# Patient Record
Sex: Male | Born: 2000 | Race: Black or African American | Hispanic: No | Marital: Single | Smoking: Never smoker
Health system: Southern US, Community
[De-identification: ages and names within clinical notes are randomized; demographics above are authoritative.]

## PROBLEM LIST (undated history)

## (undated) DIAGNOSIS — F988 Other specified behavioral and emotional disorders with onset usually occurring in childhood and adolescence: Secondary | ICD-10-CM

---
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
## 2019-06-10 IMAGING — CR DG CHEST 2V
2 series · 2 of 2 positions shown · non-contrast
Comparison: Chest x-ray of 12/01/2016

CLINICAL DATA: Cough, chest pressure and congestion

EXAM:
CHEST  2 VIEW

[chest pa]
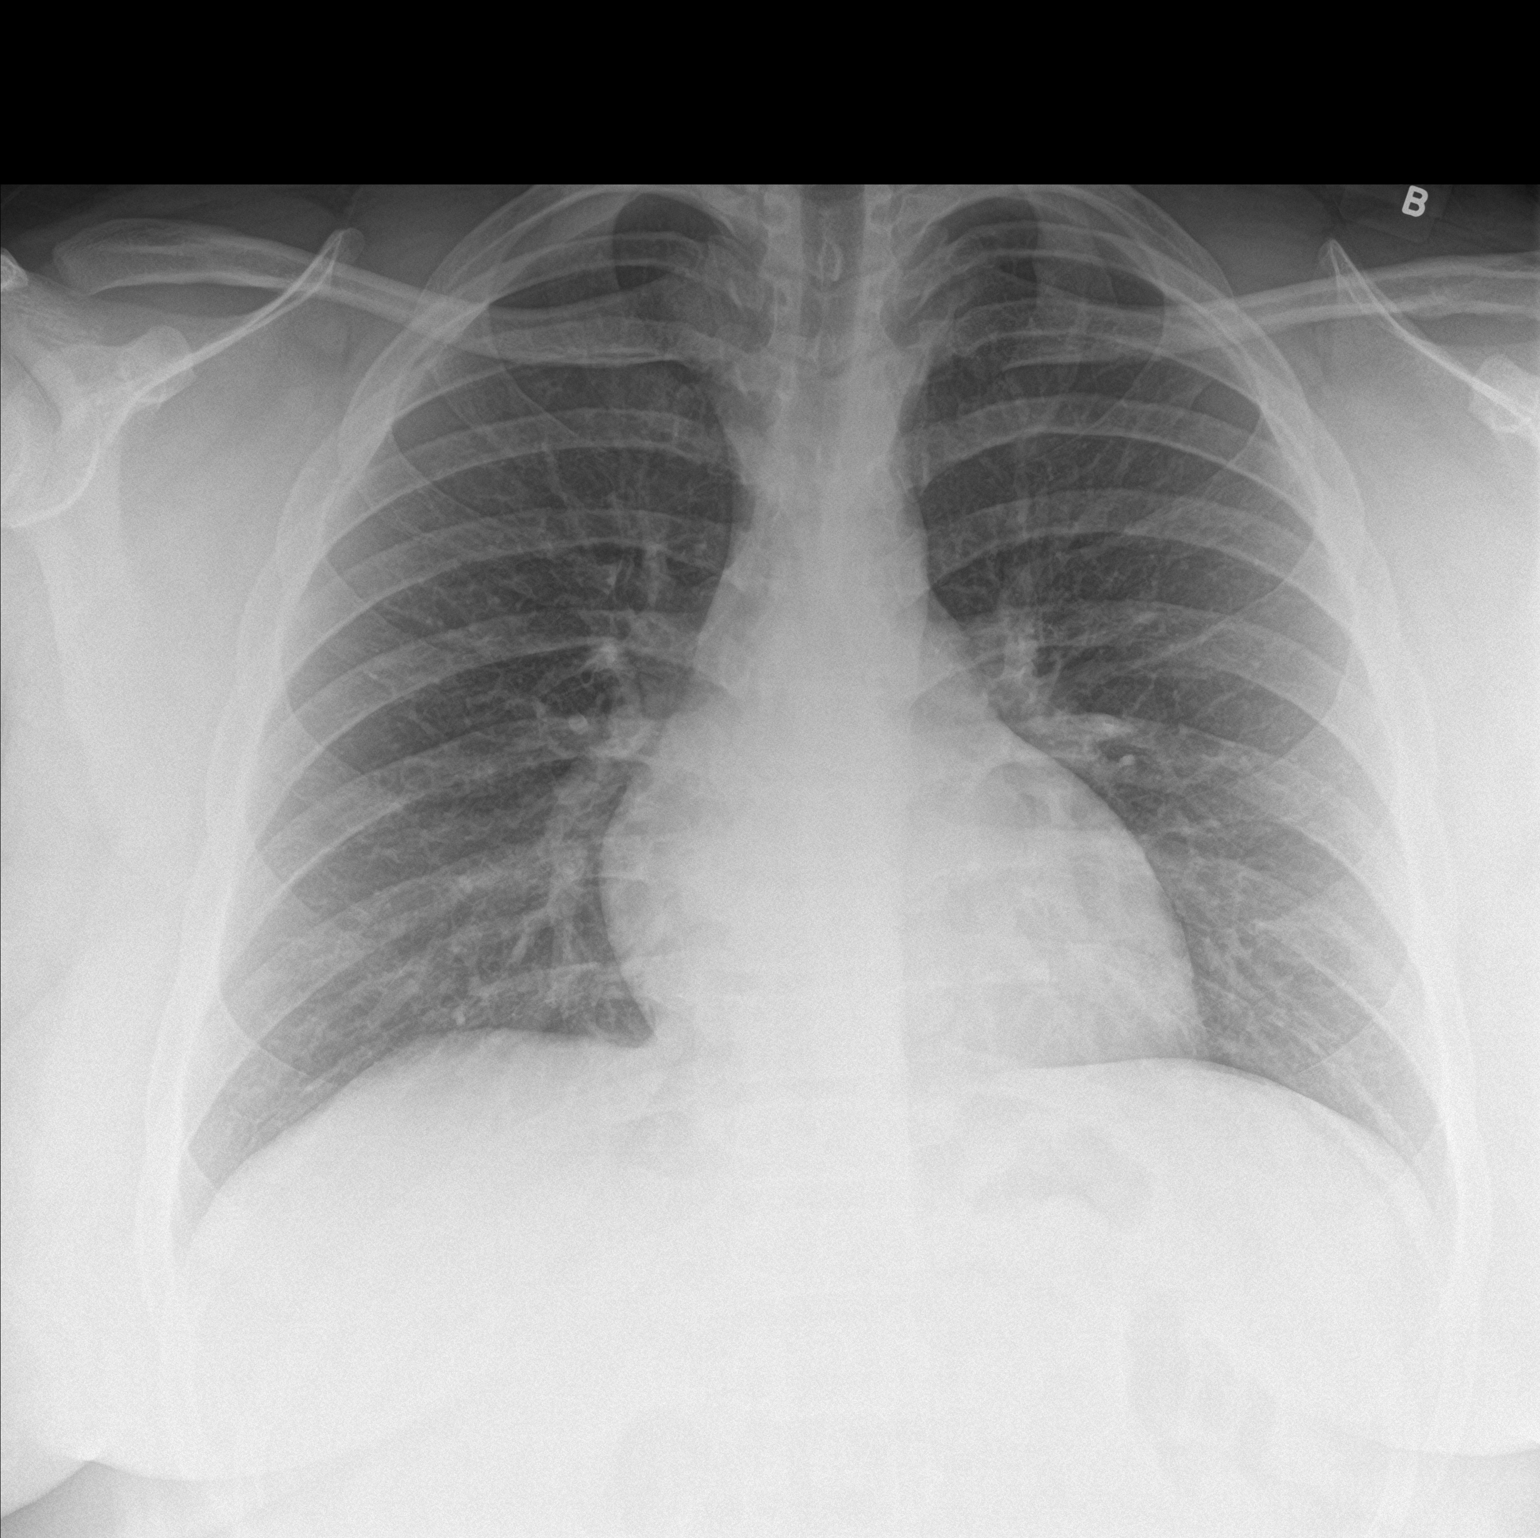

[chest lat]
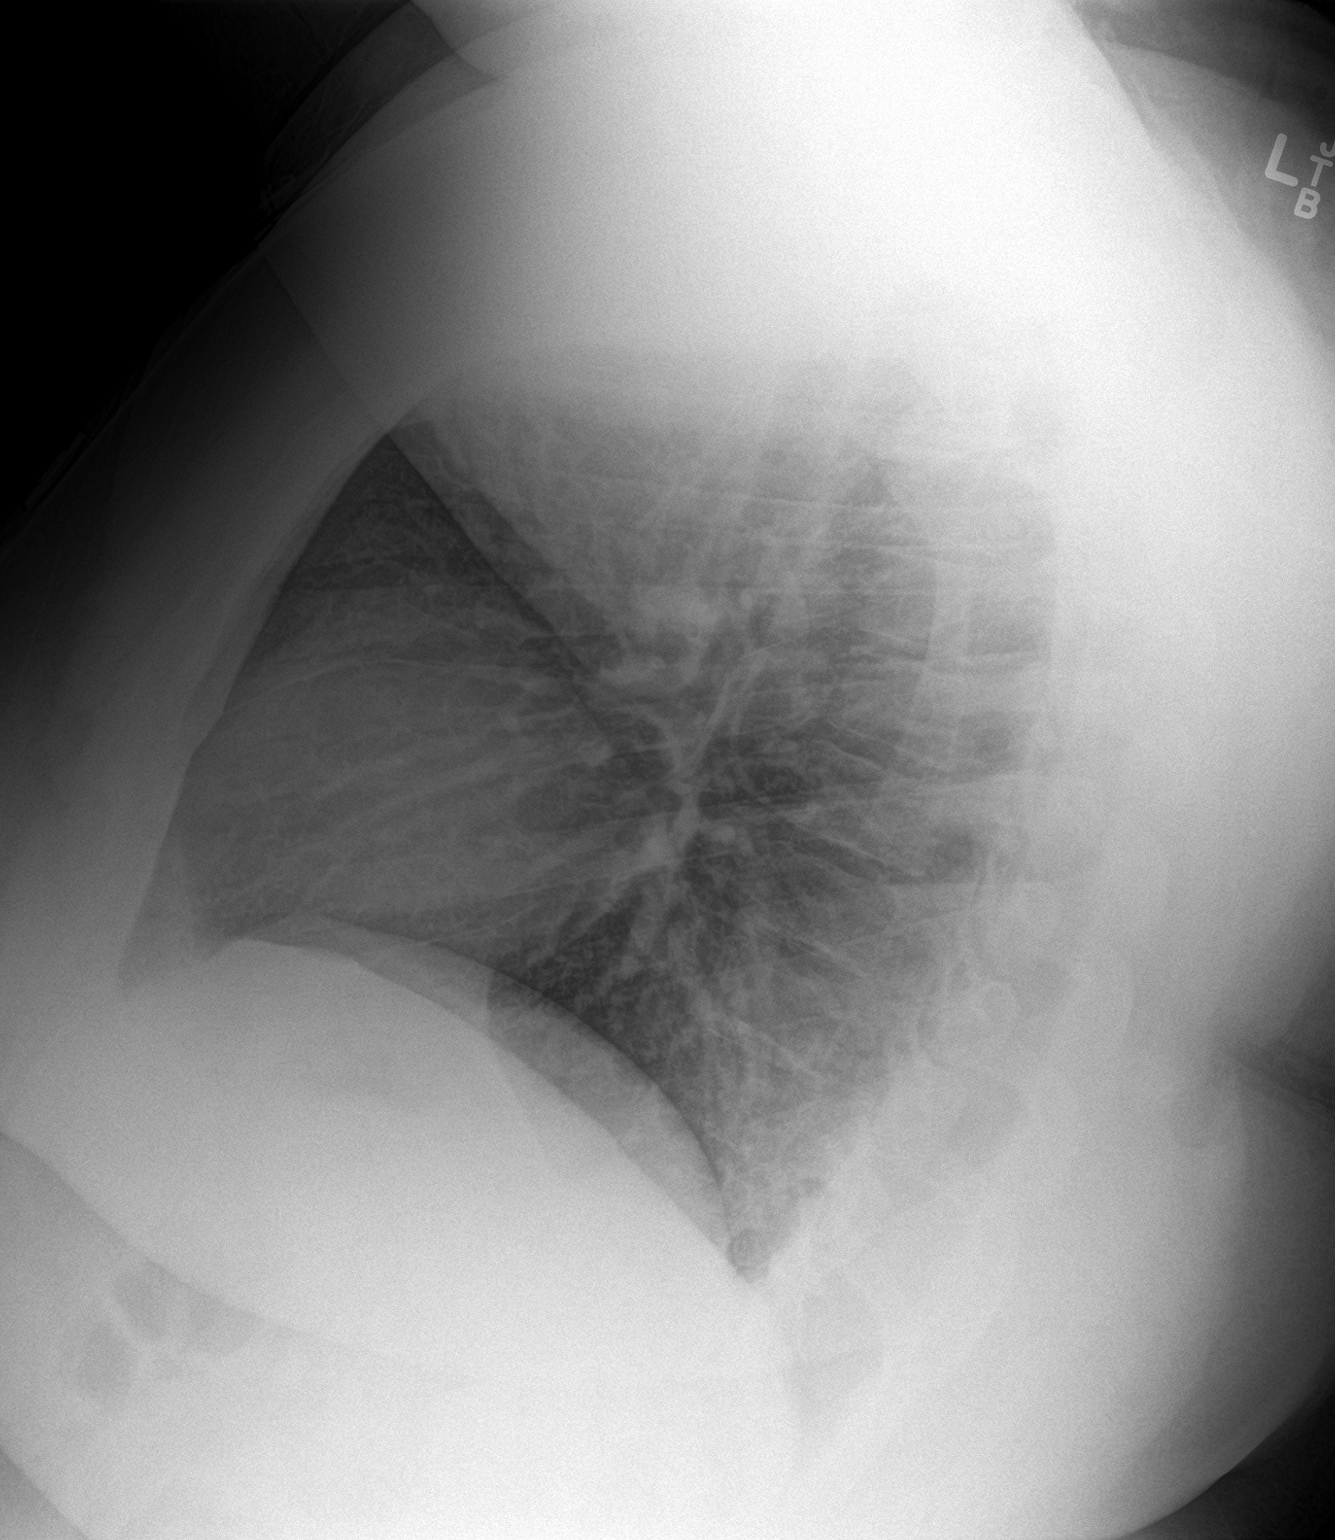

[2 of 2 positions shown; findings below may reference images not displayed]

FINDINGS: No active infiltrate or effusion is seen. Mediastinal and hilar
contours are unremarkable. The heart is within normal limits in
size. No bony abnormality is seen.
IMPRESSION: No active cardiopulmonary disease.

## 2022-11-02 ENCOUNTER — Other Ambulatory Visit: Payer: Self-pay

## 2022-11-02 ENCOUNTER — Emergency Department (HOSPITAL_COMMUNITY)
Admission: EM | Admit: 2022-11-02 | Discharge: 2022-11-02 | Disposition: A | Discharge status: Home / Self Care | Payer: PRIVATE HEALTH INSURANCE | Attending: Student | Admitting: Student

## 2022-11-02 DIAGNOSIS — J101 Influenza due to other identified influenza virus with other respiratory manifestations: Secondary | ICD-10-CM | POA: Diagnosis not present

## 2022-11-02 DIAGNOSIS — Z1152 Encounter for screening for COVID-19: Secondary | ICD-10-CM | POA: Diagnosis not present

## 2022-11-02 DIAGNOSIS — R509 Fever, unspecified: Secondary | ICD-10-CM | POA: Diagnosis present

## 2022-11-02 LAB — RESP PANEL BY RT-PCR (RSV, FLU A&B, COVID)  RVPGX2
Influenza A by PCR: POSITIVE — AB
Influenza B by PCR: NEGATIVE
Resp Syncytial Virus by PCR: NEGATIVE
SARS Coronavirus 2 by RT PCR: NEGATIVE

## 2022-11-02 LAB — COMPREHENSIVE METABOLIC PANEL
ALT: 25 U/L (ref 0–44)
AST: 39 U/L (ref 15–41)
Albumin: 3.8 g/dL (ref 3.5–5.0)
Alkaline Phosphatase: 46 U/L (ref 38–126)
Anion gap: 7 (ref 5–15)
BUN: 8 mg/dL (ref 6–20)
CO2: 26 mmol/L (ref 22–32)
Calcium: 8.9 mg/dL (ref 8.9–10.3)
Chloride: 105 mmol/L (ref 98–111)
Creatinine, Ser: 1.25 mg/dL — ABNORMAL HIGH (ref 0.61–1.24)
GFR, Estimated: >60 mL/min (ref >60)
Glucose, Bld: 94 mg/dL (ref 70–99)
Potassium: 3.8 mmol/L (ref 3.5–5.1)
Sodium: 138 mmol/L (ref 135–145)
Total Bilirubin: 1 mg/dL (ref 0.3–1.2)
Total Protein: 7.7 g/dL (ref 6.5–8.1)

## 2022-11-02 LAB — URINALYSIS, ROUTINE W REFLEX MICROSCOPIC
Bilirubin Urine: NEGATIVE
Glucose, UA: NEGATIVE mg/dL
Hgb urine dipstick: NEGATIVE
Ketones, ur: 5 mg/dL — AB
Leukocytes,Ua: NEGATIVE
Nitrite: NEGATIVE
Protein, ur: 100 mg/dL — AB
Specific Gravity, Urine: 1.019 (ref 1.005–1.030)
pH: 6 (ref 5.0–8.0)

## 2022-11-02 LAB — CBC
HCT: 49.5 % (ref 39.0–52.0)
Hemoglobin: 15.9 g/dL (ref 13.0–17.0)
MCH: 29.3 pg (ref 26.0–34.0)
MCHC: 32.1 g/dL (ref 30.0–36.0)
MCV: 91.3 fL (ref 80.0–100.0)
Platelets: 246 10*3/uL (ref 150–400)
RBC: 5.42 MIL/uL (ref 4.22–5.81)
RDW: 12.6 % (ref 11.5–15.5)
WBC: 4.8 10*3/uL (ref 4.0–10.5)
nRBC: 0 % (ref 0.0–0.2)

## 2022-11-02 LAB — LIPASE, BLOOD: Lipase: 34 U/L (ref 11–51)

## 2022-11-02 MED ORDER — NAPROXEN 375 MG PO TABS: 375.0000 mg | ORAL_TABLET | Freq: Two times a day (BID) | ORAL | 0 refills | Status: AC

## 2022-11-02 MED ORDER — KETOROLAC TROMETHAMINE 15 MG/ML IJ SOLN
15.0000 mg | Freq: Once | INTRAMUSCULAR | Status: AC
  Administered 2022-11-02: 15 mg via INTRAMUSCULAR
  Filled 2022-11-02: qty 1

## 2022-11-02 MED ORDER — ONDANSETRON 4 MG PO TBDP: 4.0000 mg | ORAL_TABLET | Freq: Three times a day (TID) | ORAL | 0 refills | Status: AC | PRN

## 2022-11-02 MED ORDER — ONDANSETRON 4 MG PO TBDP
4.0000 mg | ORAL_TABLET | Freq: Once | ORAL | Status: AC
  Administered 2022-11-02: 4 mg via ORAL
  Filled 2022-11-02: qty 1

## 2022-11-02 NOTE — ED Triage Notes (Signed)
Pt c/o abd pain since Monday with n/v/d. Also states fevers

## 2022-11-02 NOTE — ED Notes (Signed)
Attempted blood draw without success

## 2022-11-02 NOTE — ED Provider Notes (Signed)
[facility] DEPT Provider Note  CSN: [id number] Arrival date & time: 11/02/22 [id number]  Chief Complaint(s) Abdominal Pain  HPI Brian Norris is a 21 y.o. male with PMH ADD who presents emergency department for evaluation of multiple complaints including fever, chills, nausea, vomiting, headache.  Patient endorses symptoms for the last 48 hours and states that he has been trying over-the-counter Alka-Seltzer and Zyrtec with no improvement.  Denies chest pain, shortness of breath, diarrhea or other systemic symptoms.   Past Medical History Past Medical History:  Diagnosis Date   ADD (attention deficit disorder)    There are no problems to display for this patient.  Home Medication(s) Prior to Admission medications   Medication Sig Start Date End Date Taking? Authorizing Provider  ibuprofen (ADVIL,MOTRIN) 200 MG tablet Take 400 mg by mouth every 6 (six) hours as needed for moderate pain.    [provider]                                                                                                                                    Past Surgical History No past surgical history on file. Family History No family history on file.  Social History Social History   Tobacco Use   Smoking status: Never   Smokeless tobacco: Never  Substance Use Topics   Alcohol use: No   Drug use: No   Allergies Sausage [pickled meat]  Review of Systems Review of Systems  Constitutional:  Positive for chills and fever.  Gastrointestinal:  Positive for nausea and vomiting.  Neurological:  Positive for headaches.    Physical Exam Vital Signs  I have reviewed the triage vital signs BP (!) 149/97 (BP Location: Left Arm)   Pulse 91   Temp 99.1 F (37.3 C) (Oral)   Resp 18   Ht 6\' 1"  (1.854 m)   Wt (!) 172.4 kg   SpO2 97%   BMI 50.13 kg/m   Physical Exam Constitutional:      General: He is not in acute distress.    Appearance: Normal appearance.   HENT:     Head: Normocephalic and atraumatic.     Nose: No congestion or rhinorrhea.  Eyes:     General:        Right eye: No discharge.        Left eye: No discharge.     Extraocular Movements: Extraocular movements intact.     Pupils: Pupils are equal, round, and reactive to light.  Cardiovascular:     Rate and Rhythm: Normal rate and regular rhythm.     Heart sounds: No murmur heard. Pulmonary:     Effort: No respiratory distress.     Breath sounds: No wheezing or rales.  Abdominal:     General: There is no distension.     Tenderness: There is no abdominal tenderness.  Musculoskeletal:        General: Normal range  of motion.     Cervical back: Normal range of motion.  Skin:    General: Skin is warm and dry.  Neurological:     General: No focal deficit present.     Mental Status: He is alert.     Cranial Nerves: No cranial nerve deficit.     Motor: No weakness.     ED Results and Treatments Labs (all labs ordered are listed, but only abnormal results are displayed) Labs Reviewed  COMPREHENSIVE METABOLIC PANEL - Abnormal; Notable for the following components:      Result Value   Creatinine, Ser 1.25 (*)    All other components within normal limits  URINALYSIS, ROUTINE W REFLEX MICROSCOPIC - Abnormal; Notable for the following components:   Ketones, ur 5 (*)    Protein, ur 100 (*)    Bacteria, UA RARE (*)    All other components within normal limits  RESP PANEL BY RT-PCR (RSV, FLU A&B, COVID)  RVPGX2  LIPASE, BLOOD  CBC                                                                                                                          Radiology No results found.  Pertinent labs & imaging results that were available during my care of the patient were reviewed by me and considered in my medical decision making (see MDM for details).  Medications Ordered in ED Medications  ketorolac (TORADOL) 15 MG/ML injection 15 mg (15 mg Intramuscular Given 11/02/22 1018)   ondansetron (ZOFRAN-ODT) disintegrating tablet 4 mg (4 mg Oral Given 11/02/22 1018)                                                                                                                                     Procedures Procedures  (including critical care time)  Medical Decision Making / ED Course   This patient presents to the ED for concern of chills, nausea, headache, this involves an extensive number of treatment options, and is a complaint that carries with it a high risk of complications and morbidity.  The differential diagnosis includes influenza, COVID-19, unspecified viral illness, electrolyte abnormality, intra-abdominal infection  MDM: Patient seen emerged part for evaluation of multiple complaints described above.  Physical exam unremarkable.  Laboratory evaluation with a creatinine of 1.25 but is otherwise unremarkable.  Patient is influenza positive which explains majority of symptoms.  He  was given Toradol and Zofran with improvement of his symptoms.  He is outside the window for Tamiflu administration and after shared decision-making discussion around this medication we deferred prescription today.  Patient then discharged with outpatient follow-up, Naprosyn and Zofran ODT.   Additional history obtained:  -External records from outside source obtained and reviewed including: Chart review including previous notes, labs, imaging, consultation notes   Lab Tests: -I ordered, reviewed, and interpreted labs.   The pertinent results include:   Labs Reviewed  COMPREHENSIVE METABOLIC PANEL - Abnormal; Notable for the following components:      Result Value   Creatinine, Ser 1.25 (*)    All other components within normal limits  URINALYSIS, ROUTINE W REFLEX MICROSCOPIC - Abnormal; Notable for the following components:   Ketones, ur 5 (*)    Protein, ur 100 (*)    Bacteria, UA RARE (*)    All other components within normal limits  RESP PANEL BY RT-PCR (RSV, FLU A&B,  COVID)  RVPGX2  LIPASE, BLOOD  CBC      Medicines ordered and prescription drug management: Meds ordered this encounter  Medications   ketorolac (TORADOL) 15 MG/ML injection 15 mg   ondansetron (ZOFRAN-ODT) disintegrating tablet 4 mg    -I have reviewed the patients home medicines and have made adjustments as needed  Critical interventions none    Cardiac Monitoring: The patient was maintained on a cardiac monitor.  I personally viewed and interpreted the cardiac monitored which showed an underlying rhythm of: NSR  Social Determinants of Health:  Factors impacting patients care include: none   Reevaluation: After the interventions noted above, I reevaluated the patient and found that they have :improved  Co morbidities that complicate the patient evaluation  Past Medical History:  Diagnosis Date   ADD (attention deficit disorder)       Dispostion: I considered admission for this patient, but he does not meet inpatient criteria for admission and he is safe for discharge outpatient follow-up     Final Clinical Impression(s) / ED Diagnoses Final diagnoses:  None     @PCDICTATION @    Dawayne Ohair, , MD 11/02/22 1131

## 2023-02-14 ENCOUNTER — Emergency Department (HOSPITAL_BASED_OUTPATIENT_CLINIC_OR_DEPARTMENT_OTHER): Payer: PRIVATE HEALTH INSURANCE | Admitting: Radiology

## 2023-02-14 ENCOUNTER — Emergency Department (HOSPITAL_BASED_OUTPATIENT_CLINIC_OR_DEPARTMENT_OTHER)
Admission: EM | Admit: 2023-02-14 | Discharge: 2023-02-14 | Disposition: A | Discharge status: Home / Self Care | Payer: PRIVATE HEALTH INSURANCE | Attending: Emergency Medicine | Admitting: Emergency Medicine

## 2023-02-14 ENCOUNTER — Other Ambulatory Visit: Payer: Self-pay

## 2023-02-14 ENCOUNTER — Encounter (HOSPITAL_BASED_OUTPATIENT_CLINIC_OR_DEPARTMENT_OTHER): Payer: Self-pay

## 2023-02-14 DIAGNOSIS — R000 Tachycardia, unspecified: Secondary | ICD-10-CM | POA: Insufficient documentation

## 2023-02-14 DIAGNOSIS — Z1152 Encounter for screening for COVID-19: Secondary | ICD-10-CM | POA: Insufficient documentation

## 2023-02-14 DIAGNOSIS — E876 Hypokalemia: Secondary | ICD-10-CM | POA: Insufficient documentation

## 2023-02-14 DIAGNOSIS — J101 Influenza due to other identified influenza virus with other respiratory manifestations: Secondary | ICD-10-CM | POA: Insufficient documentation

## 2023-02-14 LAB — CBC WITH DIFFERENTIAL/PLATELET
Abs Immature Granulocytes: 0.01 10*3/uL (ref 0.00–0.07)
Basophils Absolute: 0 10*3/uL (ref 0.0–0.1)
Basophils Relative: 1 %
Eosinophils Absolute: 0 10*3/uL (ref 0.0–0.5)
Eosinophils Relative: 0 %
HCT: 41.5 % (ref 39.0–52.0)
Hemoglobin: 14.1 g/dL (ref 13.0–17.0)
Immature Granulocytes: 0 %
Lymphocytes Relative: 23 %
Lymphs Abs: 0.9 10*3/uL (ref 0.7–4.0)
MCH: 30.4 pg (ref 26.0–34.0)
MCHC: 34 g/dL (ref 30.0–36.0)
MCV: 89.4 fL (ref 80.0–100.0)
Monocytes Absolute: 1.1 10*3/uL — ABNORMAL HIGH (ref 0.1–1.0)
Monocytes Relative: 26 %
Neutro Abs: 2.1 10*3/uL (ref 1.7–7.7)
Neutrophils Relative %: 50 %
Platelets: 239 10*3/uL (ref 150–400)
RBC: 4.64 MIL/uL (ref 4.22–5.81)
RDW: 12.8 % (ref 11.5–15.5)
WBC: 4.2 10*3/uL (ref 4.0–10.5)
nRBC: 0 % (ref 0.0–0.2)

## 2023-02-14 LAB — RESP PANEL BY RT-PCR (RSV, FLU A&B, COVID)  RVPGX2
Influenza A by PCR: NEGATIVE
Influenza B by PCR: POSITIVE — AB
Resp Syncytial Virus by PCR: NEGATIVE
SARS Coronavirus 2 by RT PCR: NEGATIVE

## 2023-02-14 LAB — COMPREHENSIVE METABOLIC PANEL
ALT: 15 U/L (ref 0–44)
AST: 22 U/L (ref 15–41)
Albumin: 3.8 g/dL (ref 3.5–5.0)
Alkaline Phosphatase: 46 U/L (ref 38–126)
Anion gap: 12 (ref 5–15)
BUN: 10 mg/dL (ref 6–20)
CO2: 23 mmol/L (ref 22–32)
Calcium: 9 mg/dL (ref 8.9–10.3)
Chloride: 101 mmol/L (ref 98–111)
Creatinine, Ser: 1.37 mg/dL — ABNORMAL HIGH (ref 0.61–1.24)
GFR, Estimated: >60 mL/min (ref >60)
Glucose, Bld: 94 mg/dL (ref 70–99)
Potassium: 3.4 mmol/L — ABNORMAL LOW (ref 3.5–5.1)
Sodium: 136 mmol/L (ref 135–145)
Total Bilirubin: 0.9 mg/dL (ref 0.3–1.2)
Total Protein: 7.5 g/dL (ref 6.5–8.1)

## 2023-02-14 MED ORDER — POTASSIUM CHLORIDE CRYS ER 20 MEQ PO TBCR
20.0000 meq | EXTENDED_RELEASE_TABLET | Freq: Once | ORAL | Status: AC
  Administered 2023-02-14: 20 meq via ORAL
  Filled 2023-02-14: qty 1

## 2023-02-14 MED ORDER — ACETAMINOPHEN 500 MG PO TABS: 1000.0000 mg | ORAL_TABLET | Freq: Once | ORAL | Status: DC

## 2023-02-14 MED ORDER — KETOROLAC TROMETHAMINE 15 MG/ML IJ SOLN
15.0000 mg | Freq: Once | INTRAMUSCULAR | Status: AC
  Administered 2023-02-14: 15 mg via INTRAVENOUS
  Filled 2023-02-14: qty 1

## 2023-02-14 MED ORDER — SODIUM CHLORIDE 0.9 % IV BOLUS
1000.0000 mL | Freq: Once | INTRAVENOUS | Status: AC
  Administered 2023-02-14: 1000 mL via INTRAVENOUS

## 2023-02-14 NOTE — Discharge Instructions (Signed)
Your flu test is positive today.  Take ibuprofen and Tylenol to help with fever, headache, body aches, etc.  Be sure to drink plenty of fluids.  If you develop new or worsening shortness of breath, vomiting, severe headache, or any other new/concerning symptoms and return to the ER or call 911.

## 2023-02-14 NOTE — ED Triage Notes (Addendum)
Pt c/o fever onset today, 104, took tylenol around 11am, temp then 103.7, headache since Monday. Pt took own tylenol 1g on arrival.

## 2023-02-14 NOTE — ED Provider Notes (Signed)
[facility] Provider Note   CSN: [id number] Arrival date & time: 02/14/23  2015     History  Chief Complaint  Patient presents with   Fever    Brian Norris is a 22 y.o. male.  HPI 22 year old male presents with fever and headache.  He started having chills yesterday.  Started having a cough as well and then today has had a headache that is mostly occipital.  Has had a fever up to 104.  He took some Tylenol around 11:30 AM that did seem to transiently help but now that symptoms are recurrent.  His temp was 103 when he left the house.  He denies any neck stiffness or neck pain.  No vomiting.  He has been having nasal congestion but denies sore throat.  He does feel short of breath but he thinks this is from coughing so much.  Home Medications Prior to Admission medications   Medication Sig Start Date End Date Taking? Authorizing Provider  naproxen (NAPROSYN) 375 MG tablet Take 1 tablet (375 mg total) by mouth 2 (two) times daily. 11/02/22   Kommor, Madison, MD  ondansetron (ZOFRAN-ODT) 4 MG disintegrating tablet Take 1 tablet (4 mg total) by mouth every 8 (eight) hours as needed for nausea or vomiting. 11/02/22   Kommor, Debe Coder, MD      Allergies    Sausage [pickled meat]    Review of Systems   Review of Systems  Constitutional:  Positive for chills and fever.  HENT:  Positive for congestion. Negative for sore throat.   Respiratory:  Positive for cough and shortness of breath.   Gastrointestinal:  Negative for abdominal pain and vomiting.  Musculoskeletal:  Negative for neck pain and neck stiffness.  Neurological:  Positive for headaches. Negative for weakness and numbness.    Physical Exam Updated Vital Signs BP (!) 150/65 (BP Location: Left Arm)   Pulse 96   Temp 99.4 F (37.4 C) (Oral)   Resp 18   Ht 6\' 2"  (1.88 m)   Wt (!) 172.4 kg   SpO2 97%   BMI 48.79 kg/m  Physical Exam Vitals and nursing note reviewed.   Constitutional:      General: He is not in acute distress.    Appearance: He is well-developed. He is obese. He is not ill-appearing or diaphoretic.  HENT:     Head: Normocephalic and atraumatic.  Eyes:     Extraocular Movements: Extraocular movements intact.  Cardiovascular:     Rate and Rhythm: Regular rhythm. Tachycardia present.     Heart sounds: Normal heart sounds.  Pulmonary:     Effort: Pulmonary effort is normal.     Breath sounds: Normal breath sounds. No wheezing, rhonchi or rales.  Abdominal:     Palpations: Abdomen is soft.     Tenderness: There is no abdominal tenderness.  Musculoskeletal:     Cervical back: Normal range of motion. No rigidity.  Skin:    General: Skin is warm and dry.  Neurological:     Mental Status: He is alert and oriented to person, place, and time.     Comments: CN 3-12 grossly intact. 5/5 strength in all 4 extremities. Grossly normal sensation. Normal finger to nose.      ED Results / Procedures / Treatments   Labs (all labs ordered are listed, but only abnormal results are displayed) Labs Reviewed  RESP PANEL BY RT-PCR (RSV, FLU A&B, COVID)  RVPGX2 - Abnormal; Notable for the following  components:      Result Value   Influenza B by PCR POSITIVE (*)    All other components within normal limits  COMPREHENSIVE METABOLIC PANEL - Abnormal; Notable for the following components:   Potassium 3.4 (*)    Creatinine, Ser 1.37 (*)    All other components within normal limits  CBC WITH DIFFERENTIAL/PLATELET - Abnormal; Notable for the following components:   Monocytes Absolute 1.1 (*)    All other components within normal limits    EKG None  Radiology DG Chest 2 View  Result Date: 02/14/2023 CLINICAL DATA:  Cough and fever, onset today. Headache since Monday. EXAM: CHEST - 2 VIEW COMPARISON:  12/27/2017 FINDINGS: The heart size and mediastinal contours are within normal limits. Both lungs are clear. The visualized skeletal structures are  unremarkable. IMPRESSION: No active cardiopulmonary disease. Electronically Signed   By: Lucienne Capers M.D.   On: 02/14/2023 20:54    Procedures Procedures    Medications Ordered in ED Medications  acetaminophen (TYLENOL) tablet 1,000 mg (1,000 mg Oral Not Given 02/14/23 2043)  potassium chloride SA (KLOR-CON M) CR tablet 20 mEq (has no administration in time range)  sodium chloride 0.9 % bolus 1,000 mL (1,000 mLs Intravenous New Bag/Given 02/14/23 2212)  ketorolac (TORADOL) 15 MG/ML injection 15 mg (15 mg Intravenous Given 02/14/23 2212)    ED Course/ Medical Decision Making/ A&P                             Medical Decision Making Amount and/or Complexity of Data Reviewed Labs: ordered.    Details: Mild bump in creatinine, mild hypokalemia Radiology: ordered and independent interpretation performed.    Details: No pneumonia  Risk OTC drugs. Prescription drug management.   Patient presents with fever and headache.  This seems to be coming from the flu.  I have very low suspicion of CNS infection such as meningitis/encephalitis.  At first she felt a little better with Tylenol but was still tachycardic so he was given IV fluid bolus, Toradol, and labs were evaluated.  Mild bump in creatinine from December but he was given 1 L IV fluids.  He will also be given potassium.  Otherwise discussed supportive care but he appears stable for discharge home with return precautions.  Work note given.        Final Clinical Impression(s) / ED Diagnoses Final diagnoses:  Influenza B    Rx / DC Orders ED Discharge Orders     None         Sherwood Gambler, MD 02/14/23 2308

## 2023-02-15 ENCOUNTER — Emergency Department (HOSPITAL_BASED_OUTPATIENT_CLINIC_OR_DEPARTMENT_OTHER)
Admission: EM | Admit: 2023-02-15 | Discharge: 2023-02-16 | Disposition: A | Discharge status: Home / Self Care | Payer: PRIVATE HEALTH INSURANCE | Attending: Emergency Medicine | Admitting: Emergency Medicine

## 2023-02-15 ENCOUNTER — Encounter (HOSPITAL_BASED_OUTPATIENT_CLINIC_OR_DEPARTMENT_OTHER): Payer: Self-pay

## 2023-02-15 ENCOUNTER — Other Ambulatory Visit: Payer: Self-pay

## 2023-02-15 DIAGNOSIS — J101 Influenza due to other identified influenza virus with other respiratory manifestations: Secondary | ICD-10-CM | POA: Insufficient documentation

## 2023-02-15 DIAGNOSIS — J111 Influenza due to unidentified influenza virus with other respiratory manifestations: Secondary | ICD-10-CM

## 2023-02-15 MED ORDER — KETOROLAC TROMETHAMINE 30 MG/ML IJ SOLN
30.0000 mg | Freq: Once | INTRAMUSCULAR | Status: AC
  Administered 2023-02-16: 30 mg via INTRAMUSCULAR
  Filled 2023-02-15: qty 1

## 2023-02-15 NOTE — ED Provider Notes (Signed)
[facility] Provider Note   CSN: [id number] Arrival date & time: 02/15/23  2303     History  Chief Complaint  Patient presents with   Fever    Brian Norris is a 22 y.o. male.  HPI     This is a 22 year old male who presents with ongoing fever, myalgias, insomnia.  Patient was seen and evaluated yesterday and tested positive for influenza B.  Not a candidate for Tamiflu.  Reports ongoing fevers at home.  States he has not slept in 3 days secondary to chills.  He is taking Tylenol at home.  Chart reviewed.  He received fluids and Toradol yesterday.  Home Medications Prior to Admission medications   Medication Sig Start Date End Date Taking? Authorizing Provider  naproxen (NAPROSYN) 375 MG tablet Take 1 tablet (375 mg total) by mouth 2 (two) times daily. 11/02/22   Kommor, Madison, MD  ondansetron (ZOFRAN-ODT) 4 MG disintegrating tablet Take 1 tablet (4 mg total) by mouth every 8 (eight) hours as needed for nausea or vomiting. 11/02/22   Kommor, Debe Coder, MD      Allergies    Sausage [pickled meat]    Review of Systems   Review of Systems  Constitutional:  Positive for chills and fever.  HENT:  Positive for congestion.   Respiratory:  Positive for cough.   All other systems reviewed and are negative.   Physical Exam Updated Vital Signs BP (!) 140/71   Pulse 99   Temp 100.2 F (37.9 C) (Oral)   Resp 18   Ht 1.88 m (6\' 2" )   Wt (!) 172.4 kg   SpO2 100%   BMI 48.80 kg/m  Physical Exam Vitals and nursing note reviewed.  Constitutional:      Appearance: He is well-developed. He is obese. He is not ill-appearing.  HENT:     Head: Normocephalic and atraumatic.     Nose: Congestion present.     Mouth/Throat:     Comments: Posterior oropharyngeal erythema, uvula midline, no tonsillar swelling or exudate noted Eyes:     Pupils: Pupils are equal, round, and reactive to light.  Cardiovascular:     Rate and Rhythm: Normal  rate and regular rhythm.     Heart sounds: Normal heart sounds. No murmur heard. Pulmonary:     Effort: Pulmonary effort is normal. No respiratory distress.     Breath sounds: Normal breath sounds. No wheezing.  Abdominal:     Palpations: Abdomen is soft.     Tenderness: There is no abdominal tenderness.  Musculoskeletal:     Cervical back: Neck supple.  Lymphadenopathy:     Cervical: No cervical adenopathy.  Skin:    General: Skin is warm and dry.  Neurological:     Mental Status: He is alert and oriented to person, place, and time.  Psychiatric:        Mood and Affect: Mood normal.     ED Results / Procedures / Treatments   Labs (all labs ordered are listed, but only abnormal results are displayed) Labs Reviewed - No data to display  EKG None  Radiology DG Chest 2 View  Result Date: 02/14/2023 CLINICAL DATA:  Cough and fever, onset today. Headache since Monday. EXAM: CHEST - 2 VIEW COMPARISON:  12/27/2017 FINDINGS: The heart size and mediastinal contours are within normal limits. Both lungs are clear. The visualized skeletal structures are unremarkable. IMPRESSION: No active cardiopulmonary disease. Electronically Signed   By: Lucienne Capers  M.D.   On: 02/14/2023 20:54    Procedures Procedures    Medications Ordered in ED Medications  ketorolac (TORADOL) 30 MG/ML injection 30 mg (has no administration in time range)    ED Course/ Medical Decision Making/ A&P                             Medical Decision Making Risk Prescription drug management.   This patient presents to the ED for concern of upper respiratory symptoms, this involves an extensive number of treatment options, and is a complaint that carries with it a high risk of complications and morbidity.  I considered the following differential and admission for this acute, potentially life threatening condition.  The differential diagnosis includes ongoing symptoms related to known influenza B, pneumonia,  influenza complication  MDM:    Patient presents with ongoing symptoms related to likely influenza B.  He is nontoxic.  Temperature 100.2.  He relates most of his symptoms and discomfort to his ongoing fevers and chills.  He is nontoxic.  He is not hypoxic.  He is in no respiratory distress.  I again discussed with him supportive measures regarding influenza.  He is not a candidate for Tamiflu.  He was given a dose of Toradol.  Creatinine was 1.3 yesterday.  Recommend he alternate Tylenol and ibuprofen but limit ibuprofen use to 3 to 5 days.  Also she he should increase hydration.  He can trial Benadryl at night to aid in sleep.  Do not feel he needs additional imaging or workup at this time.  (Labs, imaging, consults)  Labs: I Ordered, and personally interpreted labs.  The pertinent results include: None  Imaging Studies ordered: I ordered imaging studies including none I independently visualized and interpreted imaging. I agree with the radiologist interpretation  Additional history obtained from review.  External records from outside source obtained and reviewed including prior evaluations  Cardiac Monitoring: The patient was maintained on a cardiac monitor.  If on the cardiac monitor, I personally viewed and interpreted the cardiac monitored which showed an underlying rhythm of: Sinus rhythm  Reevaluation: After the interventions noted above, I reevaluated the patient and found that they have :improved  Social Determinants of Health:  lives independently  Disposition: Discharge  Co morbidities that complicate the patient evaluation  Past Medical History:  Diagnosis Date   ADD (attention deficit disorder)      Medicines Meds ordered this encounter  Medications   ketorolac (TORADOL) 30 MG/ML injection 30 mg    I have reviewed the patients home medicines and have made adjustments as needed  Problem List / ED Course: Problem List Items Addressed This Visit   None Visit  Diagnoses     Influenza    -  Primary                   Final Clinical Impression(s) / ED Diagnoses Final diagnoses:  Influenza    Rx / DC Orders ED Discharge Orders     None         Merryl Hacker, MD 02/15/23 2340

## 2023-02-15 NOTE — ED Triage Notes (Signed)
POV from home, A&O x 4, GCS 15, amb to room  Dx with flu B yesterday, sts unable to sleep or keep fevers down. Took tylenol approx 2 hours ago but sts hasn't had motrin because he doesn't have any at home.

## 2023-02-15 NOTE — Discharge Instructions (Signed)
You were seen today for ongoing symptoms related to your flu diagnosis.  Make sure that you are staying hydrated at home.  Alternate Tylenol and ibuprofen.  Limit ibuprofen use for the next 3 to 5 days.  If you are having difficulty sleeping, you can try Benadryl at night.

## 2024-06-26 ENCOUNTER — Other Ambulatory Visit: Payer: Self-pay

## 2024-06-26 ENCOUNTER — Encounter (HOSPITAL_BASED_OUTPATIENT_CLINIC_OR_DEPARTMENT_OTHER): Payer: Self-pay

## 2024-06-26 ENCOUNTER — Emergency Department (HOSPITAL_BASED_OUTPATIENT_CLINIC_OR_DEPARTMENT_OTHER)
Admission: EM | Admit: 2024-06-26 | Discharge: 2024-06-26 | Disposition: A | Discharge status: Home / Self Care | Attending: Emergency Medicine | Admitting: Emergency Medicine

## 2024-06-26 DIAGNOSIS — B9789 Other viral agents as the cause of diseases classified elsewhere: Secondary | ICD-10-CM | POA: Insufficient documentation

## 2024-06-26 DIAGNOSIS — J028 Acute pharyngitis due to other specified organisms: Secondary | ICD-10-CM | POA: Diagnosis not present

## 2024-06-26 DIAGNOSIS — J029 Acute pharyngitis, unspecified: Secondary | ICD-10-CM

## 2024-06-26 DIAGNOSIS — R509 Fever, unspecified: Secondary | ICD-10-CM | POA: Diagnosis present

## 2024-06-26 LAB — RESP PANEL BY RT-PCR (RSV, FLU A&B, COVID)  RVPGX2
Influenza A by PCR: NEGATIVE
Influenza B by PCR: NEGATIVE
Resp Syncytial Virus by PCR: NEGATIVE
SARS Coronavirus 2 by RT PCR: NEGATIVE

## 2024-06-26 LAB — MONONUCLEOSIS SCREEN: Mono Screen: NEGATIVE

## 2024-06-26 LAB — GROUP A STREP BY PCR: Group A Strep by PCR: NOT DETECTED

## 2024-06-26 MED ORDER — DEXAMETHASONE SODIUM PHOSPHATE 10 MG/ML IJ SOLN
10.0000 mg | Freq: Once | INTRAMUSCULAR | Status: AC
  Administered 2024-06-26: 10 mg via INTRAMUSCULAR
  Filled 2024-06-26: qty 1

## 2024-06-26 MED ORDER — LIDOCAINE VISCOUS HCL 2 % MT SOLN
15.0000 mL | Freq: Once | OROMUCOSAL | Status: AC
  Administered 2024-06-26: 15 mL via OROMUCOSAL
  Filled 2024-06-26: qty 15

## 2024-06-26 MED ORDER — ACETAMINOPHEN 500 MG PO TABS
1000.0000 mg | ORAL_TABLET | Freq: Once | ORAL | Status: AC
  Administered 2024-06-26: 1000 mg via ORAL
  Filled 2024-06-26: qty 2

## 2024-06-26 NOTE — ED Notes (Signed)
 Dc instructions given, pt verbalized understanding. Out of ED with all belongings and paperwork with steady gait not in visible distress.

## 2024-06-26 NOTE — ED Triage Notes (Signed)
 Pt to ED for worsening sore throat and SOB that started on Monday. Pt noticed white spots in back of throat with painful swallowing that appeared this morning. Reports difficulty breathing on exertion. RT at bedside for eval.

## 2024-06-26 NOTE — ED Provider Notes (Signed)
 [facility] AT [facility]  Provider Note  CSN: [id number] Arrival date & time: 06/26/24 0407  History Chief Complaint  Patient presents with   Sore Throat   Shortness of Breath    Brian Norris is a 23 y.o. male reports 2-3 days of sore throat, worse with swallowing. Some SOB, but no cough. Has had fever. No known sick contacts.    Home Medications Prior to Admission medications   Medication Sig Start Date End Date Taking? Authorizing Provider  naproxen  (NAPROSYN ) 375 MG tablet Take 1 tablet (375 mg total) by mouth 2 (two) times daily. 11/02/22   Kommor, Madison, MD  ondansetron  (ZOFRAN -ODT) 4 MG disintegrating tablet Take 1 tablet (4 mg total) by mouth every 8 (eight) hours as needed for nausea or vomiting. 11/02/22   Kommor, Madison, MD     Allergies    Sausage [pickled meat]   Review of Systems   Review of Systems Please see HPI for pertinent positives and negatives  Physical Exam BP (!) 148/59 (BP Location: Right Arm)   Pulse (!) 123   Temp 100.2 F (37.9 C) (Oral)   Resp 18   Ht 6' 2 (1.88 m)   Wt (!) 172.4 kg   SpO2 96%   BMI 48.79 kg/m   Physical Exam Vitals and nursing note reviewed.  Constitutional:      Appearance: Normal appearance.  HENT:     Head: Normocephalic and atraumatic.     Nose: Nose normal.     Mouth/Throat:     Mouth: Mucous membranes are moist.     Pharynx: Uvula midline.     Tonsils: Tonsillar exudate present. No tonsillar abscesses. 3+ on the right. 3+ on the left.  Eyes:     Extraocular Movements: Extraocular movements intact.     Conjunctiva/sclera: Conjunctivae normal.  Cardiovascular:     Rate and Rhythm: Normal rate.  Pulmonary:     Effort: Pulmonary effort is normal.     Breath sounds: Normal breath sounds.  Abdominal:     General: Abdomen is flat.     Palpations: Abdomen is soft.     Tenderness: There is no abdominal tenderness.  Musculoskeletal:        General: No swelling. Normal  range of motion.     Cervical back: Neck supple.  Lymphadenopathy:     Cervical: Cervical adenopathy present.  Skin:    General: Skin is warm and dry.  Neurological:     General: No focal deficit present.     Mental Status: He is alert.  Psychiatric:        Mood and Affect: Mood normal.     ED Results / Procedures / Treatments   EKG None  Procedures Procedures  Medications Ordered in the ED Medications  acetaminophen  (TYLENOL ) tablet 1,000 mg (1,000 mg Oral Given 06/26/24 0504)  lidocaine  (XYLOCAINE ) 2 % viscous mouth solution 15 mL (15 mLs Mouth/Throat Given 06/26/24 0535)  dexamethasone  (DECADRON ) injection 10 mg (10 mg Intramuscular Given 06/26/24 0536)    Initial Impression and Plan  Patient here with sore throat, low grade fever. Some SOB, but seems to be more related to swollen tonsils and not to a primary respiratory problem. Will check Covid/Flu/RSV and Strep swabs. APAP for fever.   ED Course   Clinical Course as of 06/26/24 9386  Wed Jun 26, 2024  0505 Strep is negative.  [CS]  0514 Covid/Flu/RSV swab is neg. Given appearance of tonsils, will add Monospot.  [CS]  9387 Mono is negative. Some improvement with viscous lidocaine . Recommend OTC analgesia, clear liquids and oral hydration. PCP follow up, RTED for any other concerns.   [CS]    Clinical Course User Index [CS] Roselyn Carlin NOVAK, MD     MDM Rules/Calculators/A&P Medical Decision Making Problems Addressed: Viral pharyngitis: acute illness or injury  Amount and/or Complexity of Data Reviewed Labs: ordered. Decision-making details documented in ED Course.  Risk OTC drugs. Prescription drug management.     Final Clinical Impression(s) / ED Diagnoses Final diagnoses:  Viral pharyngitis    Rx / DC Orders ED Discharge Orders     None        Roselyn Carlin NOVAK, MD 06/26/24 [phone]
